# Patient Record
Sex: Female | Born: 1970 | Race: White | Hispanic: No | Marital: Married | Smoking: Never smoker
Health system: Southern US, Community
[De-identification: ages and names within clinical notes are randomized; demographics above are authoritative.]

## PROBLEM LIST (undated history)

## (undated) DIAGNOSIS — D759 Disease of blood and blood-forming organs, unspecified: Secondary | ICD-10-CM

## (undated) DIAGNOSIS — I809 Phlebitis and thrombophlebitis of unspecified site: Secondary | ICD-10-CM

## (undated) DIAGNOSIS — Z86711 Personal history of pulmonary embolism: Secondary | ICD-10-CM

## (undated) HISTORY — PX: ESSURE TUBAL LIGATION: SUR464

---

## 1998-03-13 ENCOUNTER — Other Ambulatory Visit: Admission: RE | Admit: 1998-03-13 | Discharge: 1998-03-13 | Payer: Self-pay | Admitting: Obstetrics and Gynecology

## 1998-10-14 ENCOUNTER — Inpatient Hospital Stay (HOSPITAL_COMMUNITY): Admission: AD | Admit: 1998-10-14 | Discharge: 1998-10-16 | Payer: Self-pay | Admitting: Obstetrics and Gynecology

## 1998-11-14 ENCOUNTER — Other Ambulatory Visit: Admission: RE | Admit: 1998-11-14 | Discharge: 1998-11-14 | Payer: Self-pay | Admitting: Obstetrics and Gynecology

## 1999-10-31 ENCOUNTER — Other Ambulatory Visit: Admission: RE | Admit: 1999-10-31 | Discharge: 1999-10-31 | Payer: Self-pay | Admitting: *Deleted

## 2000-11-15 ENCOUNTER — Other Ambulatory Visit: Admission: RE | Admit: 2000-11-15 | Discharge: 2000-11-15 | Payer: Self-pay | Admitting: *Deleted

## 2001-02-21 ENCOUNTER — Other Ambulatory Visit: Admission: RE | Admit: 2001-02-21 | Discharge: 2001-02-21 | Payer: Self-pay | Admitting: *Deleted

## 2001-08-25 ENCOUNTER — Other Ambulatory Visit: Admission: RE | Admit: 2001-08-25 | Discharge: 2001-08-25 | Payer: Self-pay | Admitting: Obstetrics and Gynecology

## 2002-03-22 ENCOUNTER — Other Ambulatory Visit: Admission: RE | Admit: 2002-03-22 | Discharge: 2002-03-22 | Payer: Self-pay | Admitting: Obstetrics and Gynecology

## 2003-03-30 ENCOUNTER — Other Ambulatory Visit: Admission: RE | Admit: 2003-03-30 | Discharge: 2003-03-30 | Payer: Self-pay | Admitting: Obstetrics and Gynecology

## 2005-02-06 ENCOUNTER — Inpatient Hospital Stay (HOSPITAL_COMMUNITY): Admission: AD | Admit: 2005-02-06 | Discharge: 2005-02-08 | Payer: Self-pay | Admitting: Obstetrics & Gynecology

## 2006-04-22 ENCOUNTER — Inpatient Hospital Stay (HOSPITAL_COMMUNITY): Admission: AD | Admit: 2006-04-22 | Discharge: 2006-04-24 | Payer: Self-pay | Admitting: Obstetrics and Gynecology

## 2010-08-24 DIAGNOSIS — Z86711 Personal history of pulmonary embolism: Secondary | ICD-10-CM

## 2010-08-24 HISTORY — DX: Personal history of pulmonary embolism: Z86.711

## 2010-12-30 ENCOUNTER — Encounter: Payer: Self-pay | Admitting: Oncology

## 2011-01-21 ENCOUNTER — Other Ambulatory Visit: Payer: Self-pay | Admitting: Oncology

## 2011-01-21 ENCOUNTER — Encounter (HOSPITAL_BASED_OUTPATIENT_CLINIC_OR_DEPARTMENT_OTHER): Payer: BC Managed Care – PPO | Admitting: Oncology

## 2011-01-21 DIAGNOSIS — I749 Embolism and thrombosis of unspecified artery: Secondary | ICD-10-CM

## 2011-01-21 LAB — CBC WITH DIFFERENTIAL/PLATELET
Eosinophils Absolute: 0.1 10*3/uL (ref 0.0–0.5)
HCT: 36.3 % (ref 34.8–46.6)
LYMPH%: 36.8 % (ref 14.0–49.7)
MCHC: 34.5 g/dL (ref 31.5–36.0)
MCV: 92.8 fL (ref 79.5–101.0)
MONO#: 0.4 10*3/uL (ref 0.1–0.9)
MONO%: 5.9 % (ref 0.0–14.0)
NEUT#: 3.4 10*3/uL (ref 1.5–6.5)
NEUT%: 54.9 % (ref 38.4–76.8)
Platelets: 250 10*3/uL (ref 145–400)
WBC: 6.1 10*3/uL (ref 3.9–10.3)

## 2011-01-21 LAB — COMPREHENSIVE METABOLIC PANEL
Albumin: 4.3 g/dL (ref 3.5–5.2)
BUN: 19 mg/dL (ref 6–23)
Calcium: 9.1 mg/dL (ref 8.4–10.5)
Chloride: 104 mEq/L (ref 96–112)
Creatinine, Ser: 0.68 mg/dL (ref 0.40–1.20)
Glucose, Bld: 113 mg/dL — ABNORMAL HIGH (ref 70–99)
Potassium: 3.8 mEq/L (ref 3.5–5.3)

## 2011-01-22 LAB — LUPUS ANTICOAGULANT PANEL
Lupus Anticoagulant: DETECTED — AB
PTT Lupus Anticoagulant: 100.3 secs — ABNORMAL HIGH (ref 30.0–45.6)
PTTLA 4:1 Mix: 60.6 secs — ABNORMAL HIGH (ref 30.0–45.6)
PTTLA Confirmation: 13 secs — ABNORMAL HIGH (ref <8.0)

## 2011-01-25 LAB — HEXAGONAL PHOSPHOLIPID NEUTRALIZATION: Hex Phosph Neut Test: POSITIVE — AB

## 2011-02-05 ENCOUNTER — Other Ambulatory Visit: Payer: Self-pay | Admitting: Obstetrics and Gynecology

## 2011-02-05 ENCOUNTER — Encounter (HOSPITAL_COMMUNITY): Payer: BC Managed Care – PPO

## 2011-02-05 LAB — CBC
HCT: 40.5 % (ref 36.0–46.0)
Hemoglobin: 13.8 g/dL (ref 12.0–15.0)
RBC: 4.37 MIL/uL (ref 3.87–5.11)
WBC: 8 10*3/uL (ref 4.0–10.5)

## 2011-02-05 LAB — PROTIME-INR
INR: 3.17 — ABNORMAL HIGH (ref 0.00–1.49)
Prothrombin Time: 33 seconds — ABNORMAL HIGH (ref 11.6–15.2)

## 2011-02-05 LAB — APTT: aPTT: 63 seconds — ABNORMAL HIGH (ref 24–37)

## 2011-02-17 ENCOUNTER — Ambulatory Visit (HOSPITAL_COMMUNITY)
Admission: RE | Admit: 2011-02-17 | Discharge: 2011-02-17 | Disposition: A | Discharge status: Home / Self Care | Payer: BC Managed Care – PPO | Source: Ambulatory Visit | Attending: Obstetrics and Gynecology | Admitting: Obstetrics and Gynecology

## 2011-02-17 DIAGNOSIS — Z302 Encounter for sterilization: Secondary | ICD-10-CM | POA: Insufficient documentation

## 2011-02-17 DIAGNOSIS — Z01818 Encounter for other preprocedural examination: Secondary | ICD-10-CM | POA: Insufficient documentation

## 2011-02-17 DIAGNOSIS — Z01812 Encounter for preprocedural laboratory examination: Secondary | ICD-10-CM | POA: Insufficient documentation

## 2011-02-17 LAB — HCG, SERUM, QUALITATIVE: Preg, Serum: NEGATIVE

## 2011-02-27 NOTE — Op Note (Signed)
  Meagan Greer, Meagan Greer                 ACCOUNT NO.:  [id number]  MEDICAL RECORD NO.:  [id number]  LOCATION:  [facility]                          [facility]  PHYSICIAN:  Lenoard Aden, M.D.DATE OF BIRTH:  1971-02-03  DATE OF PROCEDURE:  02/17/2011 DATE OF DISCHARGE:                              OPERATIVE REPORT   PREOPERATIVE DIAGNOSES: 1. Desire for elective sterilization. 2. History of thrombophilia with lupus anticoagulant disorder and     history of pulmonary embolism.  POSTOPERATIVE DIAGNOSES: 1. Desire for elective sterilization. 2. History of thrombophilia with lupus anticoagulant disorder and     history of pulmonary embolism.  PROCEDURE:  Essure tubal ligation.  SURGEON:  Lenoard Aden, MD  ASSISTANT:  None.  ANESTHESIA:  General and local.  ESTIMATED BLOOD LOSS:  Less than 50 mL.  COMPLICATIONS:  None.  DRAINS:  Foley.  COUNTS:  Correct.  The patient recovery in good condition.  BRIEF OPERATIVE NOTE:  After being apprised of risks of anesthesia, infection, bleeding, injury to abdominal organs, need for repair, delayed versus immediate complications to include bowel and bladder injury, possible need for repair, the patient was brought to the operating room where she was administered general anesthetic without complications.  Prepped and draped in usual sterile fashion. Catheterized until the bladder was empty.  Exam under anesthesia reveals a small mid positioned uterus and no adnexal masses.  Weighted speculum was placed.  Dilute Marcaine block was placed using dilute 20 mL of dilute solution in a standard fashion.  Cervix was grasped with a single- tooth tenaculum and dilated easily up to a 21 Pratt dilator. Hysteroscope placed.  Visualization reveals a normal endometrial cavity and bilateral normal tubal ostia.  No evidence of uterine perforation. The Essure tubal ligation system was introduced and the coils were placed in a standard fashion with  approximately 5 coils seen after placement and deployment in both tubes using the standard.  Good hemostasis was noted.  Devices were removed after introduction of the Essure into the left and then into the right tube.  Visualization reveals an otherwise normal endometrial cavity. The patient tolerated the procedure well, all instruments were removed. The patient was transferred to recovery in good condition.     Lenoard Aden, M.D.     RJT/MEDQ  D:  02/17/2011  T:  02/18/2011  Job:  [id number]  Electronically Signed by Olivia Mackie M.D. on 02/27/2011 07:36:11 AM

## 2011-02-27 NOTE — H&P (Signed)
  Meagan Greer, Meagan Greer                 ACCOUNT NO.:  [id number]  MEDICAL RECORD NO.:  [id number]  LOCATION:                                 FACILITY:  PHYSICIAN:  Lenoard Aden, M.D.DATE OF BIRTH:  01-Jul-1971  DATE OF ADMISSION:  02/17/2011 DATE OF DISCHARGE:                             HISTORY & PHYSICAL   CHIEF COMPLAINT:  Elective sterilization.  HISTORY OF PRESENT ILLNESS:  A 40 year old white female G3, P3 with new- onset history of thrombophilia, history of pulmonary embolism, lupus anticoagulant, who presents for desire for permanent sterilization.  She has a history of vaginal delivery x3.  She has family history of diabetes and heart disease.  She has no known drug allergies.  Her medications include Camila, Ambien, Xanax, Levoxyl, Effexor, multivitamin, and is currently off her Coumadin and Lovenox, holding for surgery.  PHYSICAL EXAMINATION:  GENERAL:  She is a well-developed, well-nourished white female, in no acute distress. HEENT:  Normal. NECK:  Supple.  Full range of motion. LUNGS:  Clear. HEART:  Regular rhythm. ABDOMEN:  Soft, nontender. PELVIC:  Revealed the uterus to be anteflexed and no adnexal masses. EXTREMITIES:  There are no cords. NEUROLOGIC:  Nonfocal. SKIN:  Intact.  IMPRESSION: 1. Desire for elective sterilization. 2. History of thrombophilia with a history of pulmonary embolism.  PLAN:  To proceed with the Essure tubal sterilization, resume Lovenox 12 hours postoperatively.     Lenoard Aden, M.D.     RJT/MEDQ  D:  02/17/2011  T:  02/17/2011  Job:  [id number]  Electronically Signed by Olivia Mackie M.D. on 02/27/2011 07:36:15 AM

## 2011-06-01 ENCOUNTER — Other Ambulatory Visit (HOSPITAL_COMMUNITY): Payer: Self-pay | Admitting: Obstetrics and Gynecology

## 2011-06-01 DIAGNOSIS — N971 Female infertility of tubal origin: Secondary | ICD-10-CM

## 2011-06-12 ENCOUNTER — Ambulatory Visit (HOSPITAL_COMMUNITY)
Admission: RE | Admit: 2011-06-12 | Discharge: 2011-06-12 | Disposition: A | Discharge status: Home / Self Care | Payer: BC Managed Care – PPO | Source: Ambulatory Visit | Attending: Obstetrics and Gynecology | Admitting: Obstetrics and Gynecology

## 2011-06-12 DIAGNOSIS — Z3049 Encounter for surveillance of other contraceptives: Secondary | ICD-10-CM | POA: Insufficient documentation

## 2011-06-12 DIAGNOSIS — N971 Female infertility of tubal origin: Secondary | ICD-10-CM

## 2011-06-12 LAB — PREGNANCY, URINE: Preg Test, Ur: NEGATIVE

## 2011-06-12 MED ORDER — IOHEXOL 180 MG/ML  SOLN: 5.0000 mL | Freq: Once | INTRAMUSCULAR | Status: AC | PRN

## 2013-02-23 ENCOUNTER — Other Ambulatory Visit: Payer: Self-pay

## 2013-02-23 DIAGNOSIS — Z1231 Encounter for screening mammogram for malignant neoplasm of breast: Secondary | ICD-10-CM

## 2013-04-05 ENCOUNTER — Ambulatory Visit
Admission: RE | Admit: 2013-04-05 | Discharge: 2013-04-05 | Disposition: A | Discharge status: Home / Self Care | Payer: BC Managed Care – PPO | Source: Ambulatory Visit

## 2013-04-05 DIAGNOSIS — Z1231 Encounter for screening mammogram for malignant neoplasm of breast: Secondary | ICD-10-CM

## 2013-04-06 ENCOUNTER — Other Ambulatory Visit: Payer: Self-pay | Admitting: Obstetrics and Gynecology

## 2013-04-06 DIAGNOSIS — R928 Other abnormal and inconclusive findings on diagnostic imaging of breast: Secondary | ICD-10-CM

## 2013-04-27 ENCOUNTER — Ambulatory Visit
Admission: RE | Admit: 2013-04-27 | Discharge: 2013-04-27 | Disposition: A | Discharge status: Home / Self Care | Payer: BC Managed Care – PPO | Source: Ambulatory Visit | Attending: Obstetrics and Gynecology | Admitting: Obstetrics and Gynecology

## 2013-04-27 DIAGNOSIS — R928 Other abnormal and inconclusive findings on diagnostic imaging of breast: Secondary | ICD-10-CM

## 2014-03-09 ENCOUNTER — Telehealth: Payer: Self-pay | Admitting: Medical Oncology

## 2014-03-09 NOTE — Telephone Encounter (Signed)
Error

## 2014-03-19 ENCOUNTER — Other Ambulatory Visit: Payer: Self-pay | Admitting: Obstetrics and Gynecology

## 2014-03-19 ENCOUNTER — Encounter (HOSPITAL_COMMUNITY): Payer: Self-pay | Admitting: Pharmacist

## 2014-03-20 ENCOUNTER — Encounter (HOSPITAL_COMMUNITY): Payer: Self-pay | Admitting: *Deleted

## 2014-03-26 NOTE — H&P (Signed)
[name]:  [place], Dyan                 ACCOUNT NO.:  [id number]  MEDICAL RECORD NO.:  [id number]  LOCATION:  [facility]                         [facility]:  [facility]  PHYSICIAN:  Lenoard Adenichard J. Deidra Spease, M.D.DATE OF BIRTH:  [birth year]  DATE OF ADMISSION:  03/27/2014 DATE OF DISCHARGE:                             HISTORY & PHYSICAL   CHIEF COMPLAINT:  Refractory menorrhagia.  HISTORY OF PRESENT ILLNESS:  A 43 year old white female G3, P3, who presents with menorrhagia and secondary anemia for definitive therapy.  MEDICATIONS:  Thyroid replacement medication, multivitamin, Xarelto, which has been discontinued for 24 hours, and Prozac.  ALLERGIES:  She has no known drug allergies.  FAMILY HISTORY:  Diabetes and heart disease.  PERSONAL HISTORY:  Vaginal delivery x3.  Essure tubal sterilization in 2012 with negative postoperative HSG.  MEDICAL PROBLEMS:  Include depression, thyroid disease, and menorrhagia.  PHYSICAL EXAMINATION:  Well-developed, well-nourished white female, in no acute distress. HEENT:  Normal. NECK:  Supple.  Full range of motion. LUNGS:  Clear. HEART:  Regular rate and rhythm. ABDOMEN:  Soft and nontender. PELVIC EXAM:  Reveals an anteflexed uterus and no adnexal masses. EXTREMITIES:  There are no cords. NEUROLOGIC:  Nonfocal. SKIN:  Intact.  IMPRESSION: 1. Refractory menorrhagia. 2. Antiphospholipid antibody syndrome, on anticoagulation.  PLAN:  Proceed with diagnostic hysteroscopy, D and C, NovaSure endometrial ablation.  Risks of anesthesia, infection, bleeding, injury to surrounding organs, possible need for repair was discussed.  Delayed versus immediate complications to include bowel and bladder injury noted.  The patient acknowledges and wishes to proceed.     Lenoard Adenichard J. Keirstan Iannello, M.D.     RJT/MEDQ  D:  03/26/2014  T:  03/26/2014  Job:  [id number]

## 2014-03-27 ENCOUNTER — Ambulatory Visit (HOSPITAL_COMMUNITY): Payer: BC Managed Care – PPO | Admitting: Certified Registered Nurse Anesthetist

## 2014-03-27 ENCOUNTER — Ambulatory Visit (HOSPITAL_COMMUNITY)
Admission: RE | Admit: 2014-03-27 | Discharge: 2014-03-27 | Disposition: A | Discharge status: Home / Self Care | Payer: BC Managed Care – PPO | Source: Ambulatory Visit | Attending: Obstetrics and Gynecology | Admitting: Obstetrics and Gynecology

## 2014-03-27 ENCOUNTER — Encounter (HOSPITAL_COMMUNITY): Payer: BC Managed Care – PPO | Admitting: Certified Registered Nurse Anesthetist

## 2014-03-27 ENCOUNTER — Encounter (HOSPITAL_COMMUNITY)
Admission: RE | Disposition: A | Discharge status: Home / Self Care | Payer: Self-pay | Source: Ambulatory Visit | Attending: Obstetrics and Gynecology

## 2014-03-27 DIAGNOSIS — D649 Anemia, unspecified: Secondary | ICD-10-CM | POA: Insufficient documentation

## 2014-03-27 DIAGNOSIS — N920 Excessive and frequent menstruation with regular cycle: Secondary | ICD-10-CM | POA: Insufficient documentation

## 2014-03-27 DIAGNOSIS — D6859 Other primary thrombophilia: Secondary | ICD-10-CM | POA: Insufficient documentation

## 2014-03-27 DIAGNOSIS — F3289 Other specified depressive episodes: Secondary | ICD-10-CM | POA: Insufficient documentation

## 2014-03-27 DIAGNOSIS — E079 Disorder of thyroid, unspecified: Secondary | ICD-10-CM | POA: Insufficient documentation

## 2014-03-27 DIAGNOSIS — D759 Disease of blood and blood-forming organs, unspecified: Secondary | ICD-10-CM | POA: Insufficient documentation

## 2014-03-27 DIAGNOSIS — F329 Major depressive disorder, single episode, unspecified: Secondary | ICD-10-CM | POA: Insufficient documentation

## 2014-03-27 HISTORY — PX: DILITATION & CURRETTAGE/HYSTROSCOPY WITH NOVASURE ABLATION: SHX5568

## 2014-03-27 HISTORY — DX: Disease of blood and blood-forming organs, unspecified: D75.9

## 2014-03-27 HISTORY — DX: Personal history of pulmonary embolism: Z86.711

## 2014-03-27 HISTORY — DX: Phlebitis and thrombophlebitis of unspecified site: I80.9

## 2014-03-27 LAB — COMPREHENSIVE METABOLIC PANEL
ALBUMIN: 3.9 g/dL (ref 3.5–5.2)
ALT: 19 U/L (ref 0–35)
AST: 22 U/L (ref 0–37)
Alkaline Phosphatase: 70 U/L (ref 39–117)
Anion gap: 10 (ref 5–15)
BUN: 20 mg/dL (ref 6–23)
CALCIUM: 9.2 mg/dL (ref 8.4–10.5)
CHLORIDE: 103 meq/L (ref 96–112)
CO2: 26 mEq/L (ref 19–32)
CREATININE: 0.68 mg/dL (ref 0.50–1.10)
GFR calc Af Amer: >90 mL/min (ref >90)
GFR calc non Af Amer: >90 mL/min (ref >90)
Glucose, Bld: 86 mg/dL (ref 70–99)
Potassium: 4.5 mEq/L (ref 3.7–5.3)
Sodium: 139 mEq/L (ref 137–147)
Total Bilirubin: 0.9 mg/dL (ref 0.3–1.2)
Total Protein: 7.3 g/dL (ref 6.0–8.3)

## 2014-03-27 LAB — CBC
HCT: 34.5 % — ABNORMAL LOW (ref 36.0–46.0)
Hemoglobin: 11.7 g/dL — ABNORMAL LOW (ref 12.0–15.0)
MCH: 31.3 pg (ref 26.0–34.0)
MCHC: 33.9 g/dL (ref 30.0–36.0)
MCV: 92.2 fL (ref 78.0–100.0)
PLATELETS: 257 10*3/uL (ref 150–400)
RBC: 3.74 MIL/uL — ABNORMAL LOW (ref 3.87–5.11)
RDW: 12.6 % (ref 11.5–15.5)
WBC: 6.1 10*3/uL (ref 4.0–10.5)

## 2014-03-27 LAB — HCG, SERUM, QUALITATIVE: PREG SERUM: NEGATIVE

## 2014-03-27 SURGERY — DILATATION & CURETTAGE/HYSTEROSCOPY WITH NOVASURE ABLATION
Anesthesia: General | Site: Vagina

## 2014-03-27 MED ORDER — KETOROLAC TROMETHAMINE 30 MG/ML IJ SOLN
INTRAMUSCULAR | Status: AC
  Filled 2014-03-27: qty 1

## 2014-03-27 MED ORDER — MIDAZOLAM HCL 2 MG/2ML IJ SOLN
INTRAMUSCULAR | Status: DC | PRN
  Administered 2014-03-27: 2 mg via INTRAVENOUS

## 2014-03-27 MED ORDER — OXYCODONE-ACETAMINOPHEN 5-325 MG PO TABS: 1.0000 | ORAL_TABLET | ORAL | Status: AC | PRN

## 2014-03-27 MED ORDER — KETOROLAC TROMETHAMINE 30 MG/ML IJ SOLN: 15.0000 mg | Freq: Once | INTRAMUSCULAR | Status: DC | PRN

## 2014-03-27 MED ORDER — GLYCOPYRROLATE 0.2 MG/ML IJ SOLN
INTRAMUSCULAR | Status: AC
  Filled 2014-03-27: qty 1

## 2014-03-27 MED ORDER — MIDAZOLAM HCL 2 MG/2ML IJ SOLN: 0.5000 mg | Freq: Once | INTRAMUSCULAR | Status: DC | PRN

## 2014-03-27 MED ORDER — FENTANYL CITRATE 0.05 MG/ML IJ SOLN: 25.0000 ug | INTRAMUSCULAR | Status: DC | PRN

## 2014-03-27 MED ORDER — MEPERIDINE HCL 25 MG/ML IJ SOLN
6.2500 mg | INTRAMUSCULAR | Status: DC | PRN
Start: 2014-03-27 — End: 2014-03-27

## 2014-03-27 MED ORDER — MIDAZOLAM HCL 2 MG/2ML IJ SOLN
INTRAMUSCULAR | Status: AC
  Filled 2014-03-27: qty 2

## 2014-03-27 MED ORDER — PROPOFOL 10 MG/ML IV BOLUS
INTRAVENOUS | Status: DC | PRN
  Administered 2014-03-27: 200 mg via INTRAVENOUS

## 2014-03-27 MED ORDER — FENTANYL CITRATE 0.05 MG/ML IJ SOLN
INTRAMUSCULAR | Status: AC
  Filled 2014-03-27: qty 2

## 2014-03-27 MED ORDER — SODIUM CHLORIDE 0.9 % IJ SOLN
INTRAMUSCULAR | Status: AC
  Filled 2014-03-27: qty 50

## 2014-03-27 MED ORDER — BUPIVACAINE HCL (PF) 0.25 % IJ SOLN
INTRAMUSCULAR | Status: DC | PRN
  Administered 2014-03-27: 20 mL

## 2014-03-27 MED ORDER — CEFAZOLIN SODIUM-DEXTROSE 2-3 GM-% IV SOLR
INTRAVENOUS | Status: AC
  Filled 2014-03-27: qty 50

## 2014-03-27 MED ORDER — CEFAZOLIN SODIUM-DEXTROSE 2-3 GM-% IV SOLR
2.0000 g | INTRAVENOUS | Status: AC
  Administered 2014-03-27: 2 g via INTRAVENOUS

## 2014-03-27 MED ORDER — LIDOCAINE HCL (CARDIAC) 20 MG/ML IV SOLN
INTRAVENOUS | Status: AC
  Filled 2014-03-27: qty 5

## 2014-03-27 MED ORDER — ONDANSETRON HCL 4 MG/2ML IJ SOLN
INTRAMUSCULAR | Status: DC | PRN
  Administered 2014-03-27: 4 mg via INTRAVENOUS

## 2014-03-27 MED ORDER — FENTANYL CITRATE 0.05 MG/ML IJ SOLN
INTRAMUSCULAR | Status: DC | PRN
  Administered 2014-03-27: 50 ug via INTRAVENOUS
  Administered 2014-03-27 (×2): 25 ug via INTRAVENOUS

## 2014-03-27 MED ORDER — BUPIVACAINE HCL (PF) 0.25 % IJ SOLN
INTRAMUSCULAR | Status: AC
  Filled 2014-03-27: qty 30

## 2014-03-27 MED ORDER — LACTATED RINGERS IV SOLN
INTRAVENOUS | Status: DC
  Administered 2014-03-27 (×2): via INTRAVENOUS

## 2014-03-27 MED ORDER — LACTATED RINGERS IR SOLN
Status: DC | PRN
  Administered 2014-03-27: 3000 mL

## 2014-03-27 MED ORDER — DEXAMETHASONE SODIUM PHOSPHATE 10 MG/ML IJ SOLN
INTRAMUSCULAR | Status: AC
  Filled 2014-03-27: qty 1

## 2014-03-27 MED ORDER — PROMETHAZINE HCL 25 MG/ML IJ SOLN: 6.2500 mg | INTRAMUSCULAR | Status: DC | PRN

## 2014-03-27 MED ORDER — SCOPOLAMINE 1 MG/3DAYS TD PT72
MEDICATED_PATCH | TRANSDERMAL | Status: AC
  Administered 2014-03-27: 1.5 mg via TRANSDERMAL
  Filled 2014-03-27: qty 1

## 2014-03-27 MED ORDER — LIDOCAINE HCL (CARDIAC) 20 MG/ML IV SOLN
INTRAVENOUS | Status: DC | PRN
  Administered 2014-03-27: 60 mg via INTRAVENOUS

## 2014-03-27 MED ORDER — KETOROLAC TROMETHAMINE 30 MG/ML IJ SOLN
INTRAMUSCULAR | Status: DC | PRN
  Administered 2014-03-27: 30 mg via INTRAVENOUS

## 2014-03-27 MED ORDER — DEXAMETHASONE SODIUM PHOSPHATE 10 MG/ML IJ SOLN
INTRAMUSCULAR | Status: DC | PRN
  Administered 2014-03-27: 10 mg via INTRAVENOUS

## 2014-03-27 MED ORDER — SCOPOLAMINE 1 MG/3DAYS TD PT72
1.0000 | MEDICATED_PATCH | Freq: Once | TRANSDERMAL | Status: AC
  Administered 2014-03-27: 1 via TRANSDERMAL
  Administered 2014-03-27: 1.5 mg via TRANSDERMAL

## 2014-03-27 MED ORDER — VASOPRESSIN 20 UNIT/ML IJ SOLN
INTRAMUSCULAR | Status: AC
  Filled 2014-03-27: qty 1

## 2014-03-27 MED ORDER — ONDANSETRON HCL 4 MG/2ML IJ SOLN
INTRAMUSCULAR | Status: AC
  Filled 2014-03-27: qty 2

## 2014-03-27 SURGICAL SUPPLY — 15 items
ABLATOR ENDOMETRIAL BIPOLAR (ABLATOR) ×3 IMPLANT
CATH ROBINSON RED A/P 16FR (CATHETERS) ×3 IMPLANT
CLOTH BEACON ORANGE TIMEOUT ST (SAFETY) ×3 IMPLANT
CONTAINER PREFILL 10% NBF 60ML (FORM) ×6 IMPLANT
DRAPE HYSTEROSCOPY (DRAPE) ×3 IMPLANT
GLOVE BIO SURGEON STRL SZ7.5 (GLOVE) ×3 IMPLANT
GOWN STRL REUS W/TWL LRG LVL3 (GOWN DISPOSABLE) ×6 IMPLANT
PACK VAGINAL MINOR WOMEN LF (CUSTOM PROCEDURE TRAY) ×3 IMPLANT
PAD OB MATERNITY 4.3X12.25 (PERSONAL CARE ITEMS) ×3 IMPLANT
PAD PREP 24X48 CUFFED NSTRL (MISCELLANEOUS) ×3 IMPLANT
SET TUBING HYSTEROSCOPY 2 NDL (TUBING) ×2 IMPLANT
SYR TB 1ML 25GX5/8 (SYRINGE) ×3 IMPLANT
TOWEL OR 17X24 6PK STRL BLUE (TOWEL DISPOSABLE) ×6 IMPLANT
TUBE HYSTEROSCOPY W Y-CONNECT (TUBING) ×2 IMPLANT
WATER STERILE IRR 1000ML POUR (IV SOLUTION) ×3 IMPLANT

## 2014-03-27 NOTE — Anesthesia Postprocedure Evaluation (Signed)
Anesthesia Post Note  Patient: Meagan Greer  Procedure(s) Performed: Procedure(s) (LRB): DILATATION & CURETTAGE/HYSTEROSCOPY WITH NOVASURE ABLATION (N/A)  Anesthesia type: General  Patient location: PACU  Post pain: Pain level controlled  Post assessment: Post-op Vital signs reviewed  Last Vitals:  Filed Vitals:   03/27/14 1230  BP: 114/68  Pulse: 68  Temp: 36.6 C  Resp: 22    Post vital signs: Reviewed  Level of consciousness: sedated  Complications: No apparent anesthesia complications

## 2014-03-27 NOTE — Op Note (Signed)
03/27/2014  11:22 AM  PATIENT:  Meagan Greer  43 y.o. female  PRE-OPERATIVE DIAGNOSIS:  Menorrhagia  POST-OPERATIVE DIAGNOSIS:  menorrhagia  PROCEDURE:  Procedure(s): DILATATION & CURETTAGE/HYSTEROSCOPY WITH NOVASURE ABLATION  SURGEON:  Surgeon(s): Lenoard Adenichard J Macari Zalesky, MD  ASSISTANTS: none   ANESTHESIA:   local and general  ESTIMATED BLOOD LOSS: minimal  DRAINS: none   LOCAL MEDICATIONS USED:  MARCAINE    and Amount: 20 ml  SPECIMEN:  Source of Specimen:  [facility]  DISPOSITION OF SPECIMEN:  PATHOLOGY  COUNTS:  YES  DICTATION #: [id number]  PLAN OF CARE: dc home  PATIENT DISPOSITION:  PACU - hemodynamically stable.

## 2014-03-27 NOTE — Anesthesia Preprocedure Evaluation (Signed)
Anesthesia Evaluation  Patient identified by MRN, date of birth, ID band Patient awake    Reviewed: Allergy & Precautions, H&P , Patient's Chart, lab work & pertinent test results, reviewed documented beta blocker date and time   History of Anesthesia Complications Negative for: history of anesthetic complications  Airway Mallampati: II TM Distance: >3 FB Neck ROM: full    Dental   Pulmonary  breath sounds clear to auscultation        Cardiovascular Exercise Tolerance: Good Rhythm:regular Rate:Normal     Neuro/Psych negative psych ROS   GI/Hepatic   Endo/Other    Renal/GU      Musculoskeletal   Abdominal   Peds  Hematology  (+) Blood dyscrasia, ,   Anesthesia Other Findings pe in 2012  Reproductive/Obstetrics                           Anesthesia Physical Anesthesia Plan  ASA: II  Anesthesia Plan: General LMA   Post-op Pain Management:    Induction:   Airway Management Planned:   Additional Equipment:   Intra-op Plan:   Post-operative Plan:   Informed Consent: I have reviewed the patients History and Physical, chart, labs and discussed the procedure including the risks, benefits and alternatives for the proposed anesthesia with the patient or authorized representative who has indicated his/her understanding and acceptance.   Dental Advisory Given  Plan Discussed with: CRNA, Surgeon and Anesthesiologist  Anesthesia Plan Comments:         Anesthesia Quick Evaluation

## 2014-03-27 NOTE — Transfer of Care (Signed)
Immediate Anesthesia Transfer of Care Note  Patient: Meagan Greer  Procedure(s) Performed: Procedure(s): DILATATION & CURETTAGE/HYSTEROSCOPY WITH NOVASURE ABLATION (N/A)  Patient Location: PACU  Anesthesia Type:General  Level of Consciousness: awake, alert  and oriented  Airway & Oxygen Therapy: Patient Spontanous Breathing and Patient connected to nasal cannula oxygen  Post-op Assessment: Report given to PACU RN, Post -op Vital signs reviewed and stable and Patient moving all extremities X 4  Post vital signs: Reviewed and stable  Complications: No apparent anesthesia complications

## 2014-03-27 NOTE — Op Note (Signed)
[name]:  Meagan Greer, Meagan Greer                 ACCOUNT NO.:  [id number]  MEDICAL RECORD NO.:  [id number]  LOCATION:  [facility]                          [facility]  PHYSICIAN:  Lenoard Adenichard J. Yuvaan Olander, M.D.DATE OF BIRTH:  [birth year]  DATE OF PROCEDURE: DATE OF DISCHARGE:  03/27/2014                              OPERATIVE REPORT   PREOPERATIVE DIAGNOSIS:  Refractory menorrhagia with secondary anemia.  POSTOPERATIVE DIAGNOSIS:  Refractory menorrhagia with secondary anemia.  PROCEDURE:  Diagnostic hysteroscopy, dilation and curettage with NovaSure endometrial ablation.  SURGEON:  Lenoard Adenichard J. Timira Bieda, MD  ASSISTANT:  None.  ANESTHESIA:  General and local.  ESTIMATED BLOOD LOSS:  Less than 50 mL.  FLUID DEFICIT:  50 mL.  COMPLICATIONS:  None.  DRAINS:  None.  COUNTS:  Correct.  The patient to recovery in good condition.  SPECIMENS:  Endometrial curettings to pathology.  BRIEF OPERATIVE NOTE:  After being apprised of risks of anesthesia, infection, bleeding, injury to surrounding organs, and possible need for repair, delayed versus immediate complications to include bowel and bladder injury, possible need for repair, the patient was brought to the operating room where she was administered general anesthetic without complications.  Prepped and draped in usual sterile fashion, catheterized until the bladder was empty.  Exam under anesthesia reveals an anteflexed uterus and no adnexal masses.  At this time, cervix was grasped using a single-tooth tenaculum.  A dilute Marcaine solution 20 mL was applied with a standard paracervical block.  Cervix was easily dilated up to a 21 Pratt dilator.  Hysteroscope placed.  Visualization reveals a normal Essure placement on the left, and there was no evidence of the Essure placement on the right, although a preoperative normal HSG has been previously noted.  At this time, the rest of the cavity appeared normal.  The D and C was  collected using sharp  curettage in a 4-quadrant method.  The hysteroscope was removed.  The NovaSure device was seated in the standard fashion to a length of 6 and a width of 3. The device was initiated with a power after the CO2 test was performed and was negative.  After the NovaSure procedure, the device was removed and inspected and found to be intact.  Revisualization of the endometrial cavity revealed no evidence of uterine perforation, and a normal presenting still Essure placement on the left, and similarly presenting as noted on the right, a well ablated cavity was noted.  The patient tolerated this procedure well.  All instruments were removed.  The patient was transferred to recovery in good condition.     Lenoard Adenichard J. Nur Rabold, M.D.     RJT/MEDQ  D:  03/27/2014  T:  03/27/2014  Job:  [id number]

## 2014-03-27 NOTE — Progress Notes (Signed)
Patient ID: Meagan Greer, female   DOB: 05/21/1971, 43 y.o.   MRN: [id number] Patient seen and examined. Consent witnessed and signed. No changes noted. Update completed. CBC    Component Value Date/Time   WBC 6.1 03/27/2014 0915   WBC 6.1 01/21/2011 1328   RBC 3.74* 03/27/2014 0915   RBC 3.91 01/21/2011 1328   HGB 11.7* 03/27/2014 0915   HGB 12.5 01/21/2011 1328   HCT 34.5* 03/27/2014 0915   HCT 36.3 01/21/2011 1328   PLT 257 03/27/2014 0915   PLT 250 01/21/2011 1328   MCV 92.2 03/27/2014 0915   MCV 92.8 01/21/2011 1328   MCH 31.3 03/27/2014 0915   MCH 32.0 01/21/2011 1328   MCHC 33.9 03/27/2014 0915   MCHC 34.5 01/21/2011 1328   RDW 12.6 03/27/2014 0915   RDW 12.8 01/21/2011 1328   LYMPHSABS 2.2 01/21/2011 1328   MONOABS 0.4 01/21/2011 1328   EOSABS 0.1 01/21/2011 1328   BASOSABS 0.0 01/21/2011 1328

## 2014-03-28 ENCOUNTER — Encounter (HOSPITAL_COMMUNITY): Payer: Self-pay | Admitting: Obstetrics and Gynecology

## 2017-04-13 ENCOUNTER — Other Ambulatory Visit: Payer: Self-pay | Admitting: Obstetrics and Gynecology

## 2017-04-13 DIAGNOSIS — R928 Other abnormal and inconclusive findings on diagnostic imaging of breast: Secondary | ICD-10-CM

## 2017-04-20 ENCOUNTER — Ambulatory Visit
Admission: RE | Admit: 2017-04-20 | Discharge: 2017-04-20 | Disposition: A | Discharge status: Home / Self Care | Payer: Commercial Managed Care - PPO | Source: Ambulatory Visit | Attending: Obstetrics and Gynecology | Admitting: Obstetrics and Gynecology

## 2017-04-20 ENCOUNTER — Ambulatory Visit
Admission: RE | Admit: 2017-04-20 | Discharge: 2017-04-20 | Disposition: A | Discharge status: Home / Self Care | Payer: BC Managed Care – PPO | Source: Ambulatory Visit | Attending: Obstetrics and Gynecology | Admitting: Obstetrics and Gynecology

## 2017-04-20 DIAGNOSIS — R928 Other abnormal and inconclusive findings on diagnostic imaging of breast: Secondary | ICD-10-CM

## 2018-06-24 IMAGING — US ULTRASOUND LEFT BREAST LIMITED
1 series · 13 of 14 positions shown · non-contrast
Comparison: April 08, 2017

CLINICAL DATA: 46-year-old patient recalled from recent screening
mammogram for evaluation of a possible mass in the upper-outer
quadrant of the left breast.

When I was in the ultrasound room with the patient today, she asked
me to evaluate a small superficial lump associated with the skin of
the far medial right breast. See below.
EXAM:
2D DIGITAL DIAGNOSTIC LEFT MAMMOGRAM WITH ADJUNCT TOMO
ULTRASOUND LEFT BREAST

[Series 1: ultrasound left breast limited · 0.07mm/px · 13 of 14 slices shown]
[im 1/14]
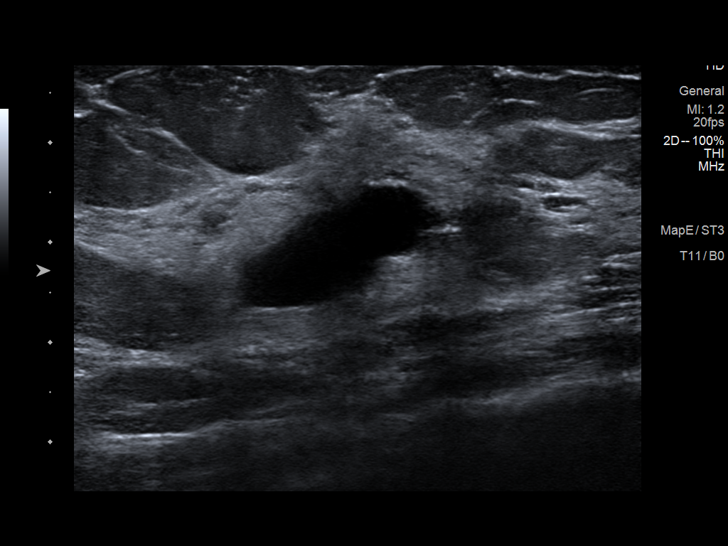
[im 2/14]
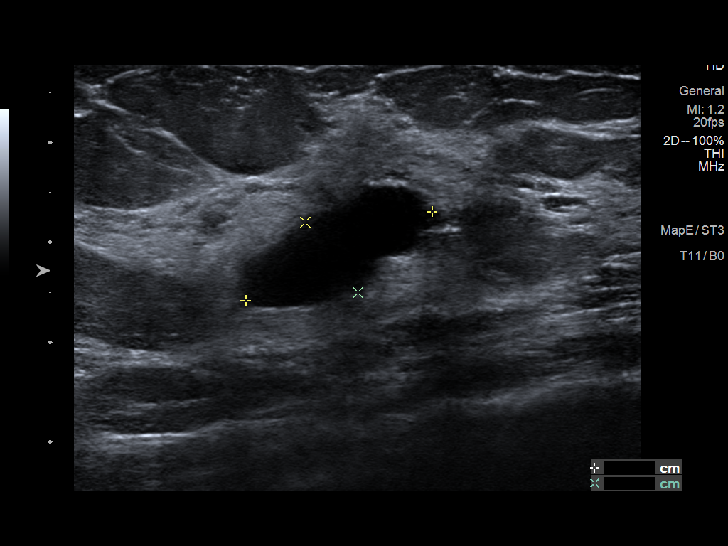
[im 3/14]
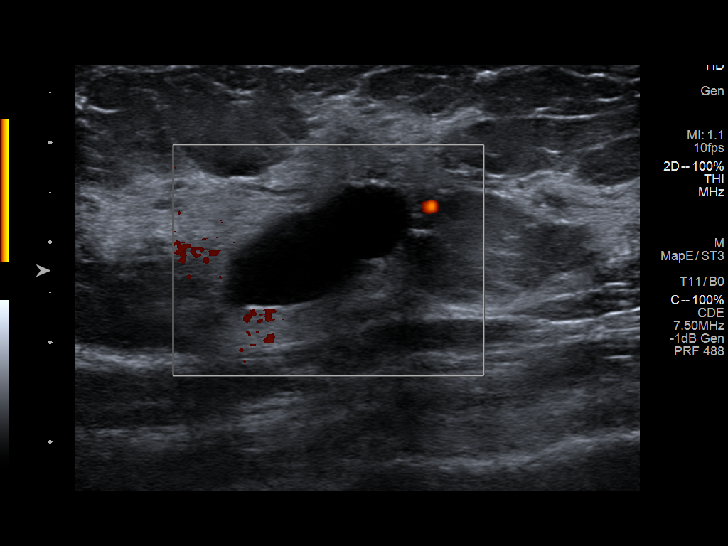
[im 4/14]
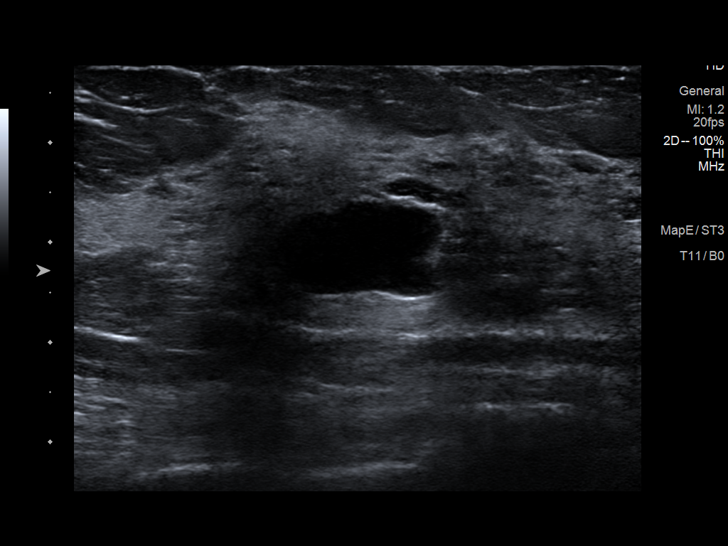
[im 5/14]
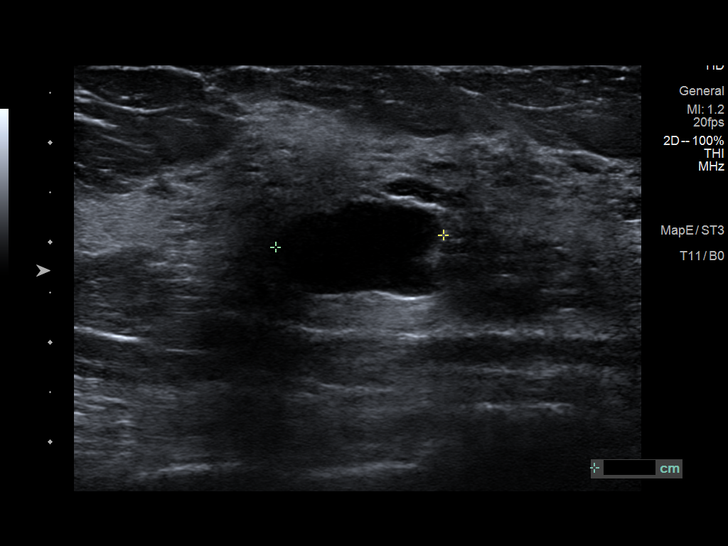
[im 6/14]
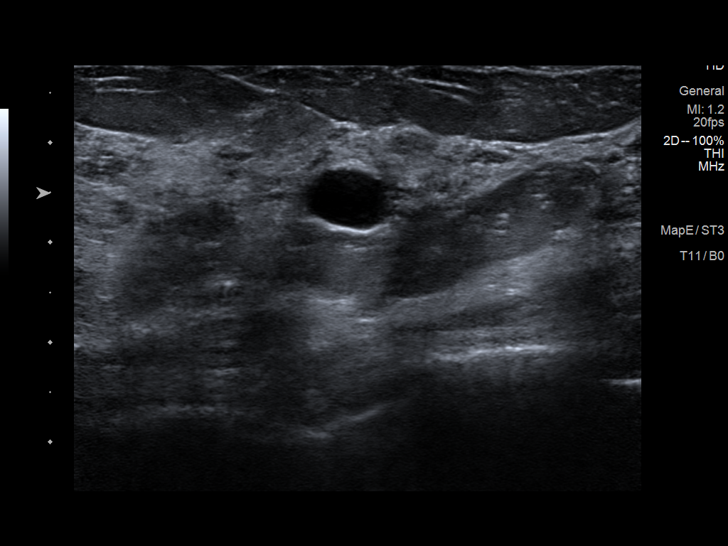
[im 8/14]
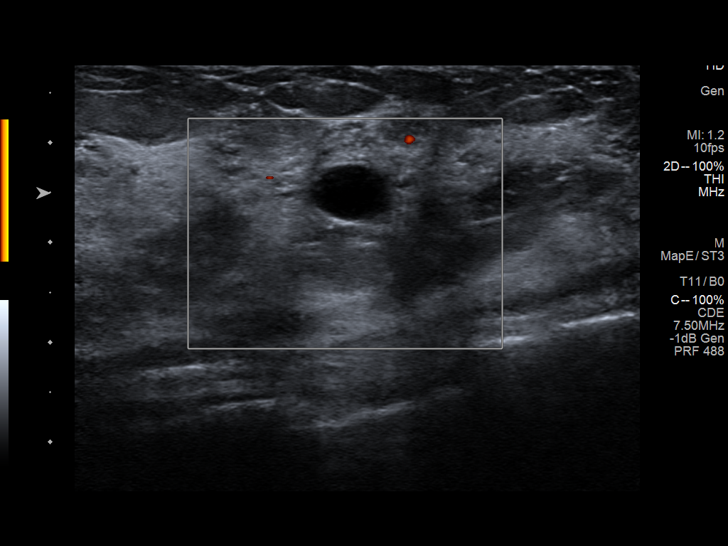
[im 9/14]
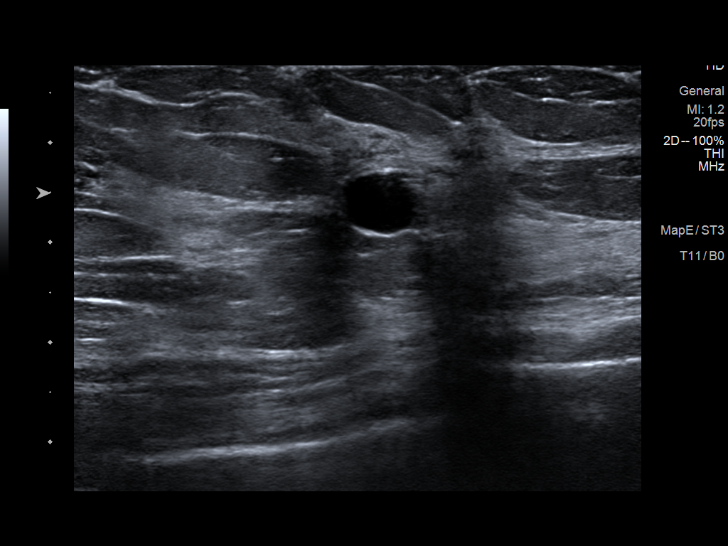
[im 10/14]
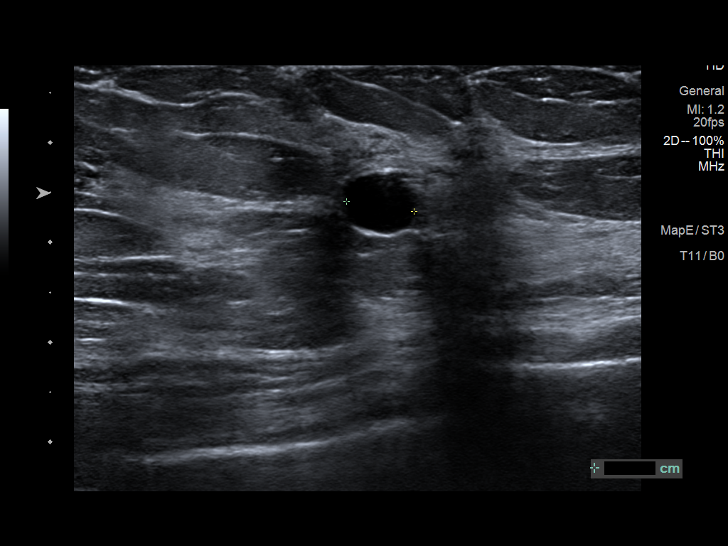
[im 11/14]
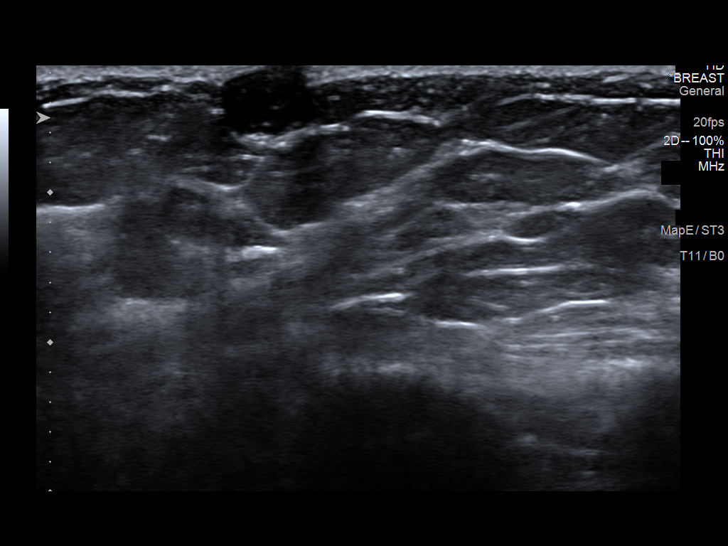
[im 12/14]
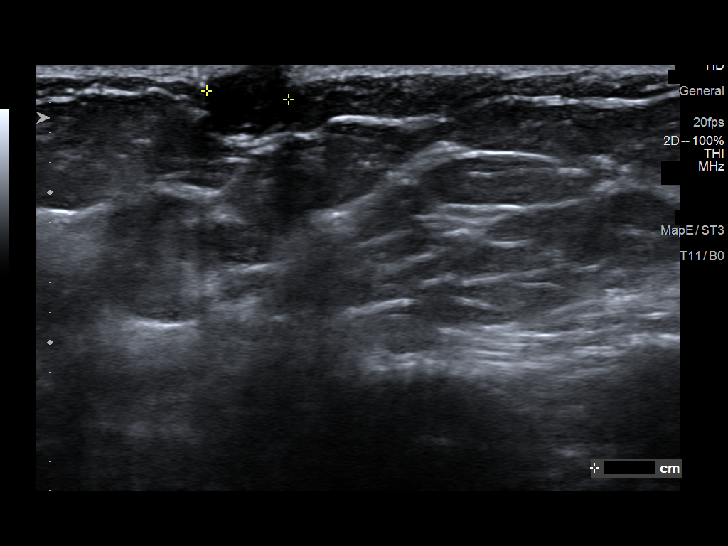
[im 13/14]
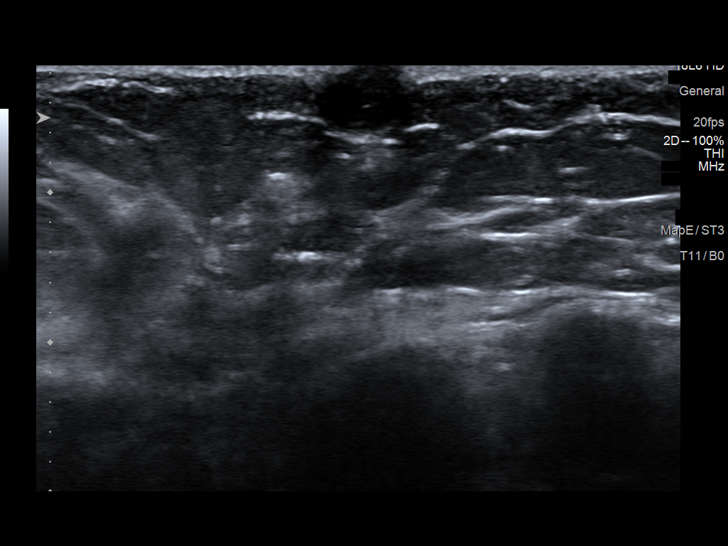
[im 14/14]
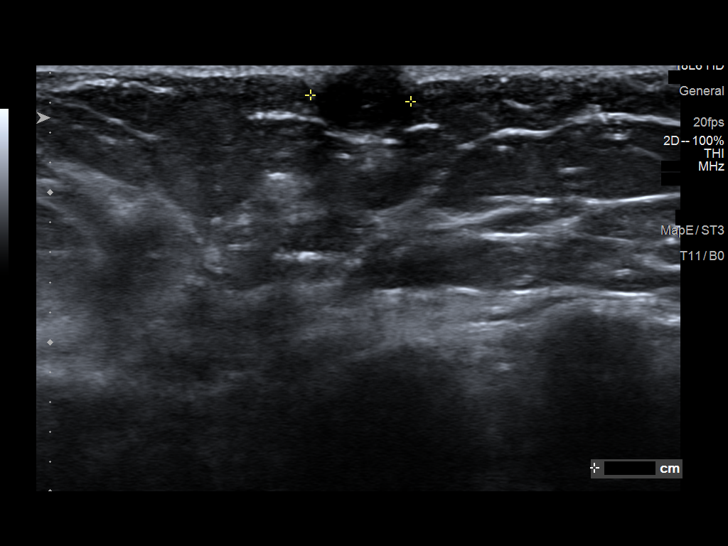

[13 of 14 positions shown; findings below may reference images not displayed]

ACR Breast Density Category c: The breast tissue is heterogeneously
dense, which may obscure small masses.
FINDINGS: Spot compression views including tomography of the upper outer left
breast confirm at least 2 circumscribed oval masses, the largest
approximately 1.7 cm.

Targeted ultrasound is performed, showing two simple cysts in the
left breast in the region of mammographic concern. At 2 o'clock
position 5 cm from the nipple is a 2.1 x 0.9 x 1.7 cm cyst. At 3
o'clock position 5 cm from the nipple is a 0.7 x 0.8 x 0.5 cm simple
cyst.

The patient shows me a palpable area of concern in the far medial
contralateral right breast, where physical exam findings are most
consistent with a benign sebaceous cyst. At no additional charge,
confirmatory ultrasound images of the palpable sebaceous cyst in the
right breast are performed showing a mildly complex cyst in the
subcutaneous fat of the right breast extending into the dermis, with
a tract to the superficial aspect of the dermis. This measures
cm greatest diameter.
IMPRESSION: 1. Benign simple cysts in the 2 and 3 o'clock positions of the left
breast. No evidence of malignancy is identified in the upper outer
quadrant of the left breast.
2. The patient palpates a benign sebaceous cyst the medial right
breast.

RECOMMENDATION:
Screening mammogram in one year.(Code:[id number])

I have discussed the findings and recommendations with the patient.
Results were also provided in writing at the conclusion of the
visit. If applicable, a reminder letter will be sent to the patient
regarding the next appointment.

BI-RADS CATEGORY  2: Benign.

## 2019-05-26 ENCOUNTER — Other Ambulatory Visit: Payer: Self-pay | Admitting: Obstetrics and Gynecology

## 2019-05-26 DIAGNOSIS — N6310 Unspecified lump in the right breast, unspecified quadrant: Secondary | ICD-10-CM

## 2019-06-05 ENCOUNTER — Ambulatory Visit
Admission: RE | Admit: 2019-06-05 | Discharge: 2019-06-05 | Disposition: A | Discharge status: Home / Self Care | Payer: Commercial Managed Care - PPO | Source: Ambulatory Visit | Attending: Obstetrics and Gynecology | Admitting: Obstetrics and Gynecology

## 2019-06-05 ENCOUNTER — Other Ambulatory Visit: Payer: Self-pay

## 2019-06-05 DIAGNOSIS — N6310 Unspecified lump in the right breast, unspecified quadrant: Secondary | ICD-10-CM

## 2019-07-05 ENCOUNTER — Telehealth: Payer: Self-pay | Admitting: *Deleted

## 2019-07-05 NOTE — Telephone Encounter (Signed)
Pt states she is not having any problems. Is going to follow up with PCP

## 2019-07-05 NOTE — Telephone Encounter (Signed)
Meagan Greer left a message stating she saw Dr Alen Blew in 2012- had been positive for the  Lupus Anticoagulant gene. She is now negative. States Dr Alen Blew predicted that might happen and to come back to see him if it happens.

## 2019-07-05 NOTE — Telephone Encounter (Signed)
No follow up is needed unless new issues are noted. If so, I need records from her PCP or Ob/Gyn.

## 2020-08-08 IMAGING — MG MM DIGITAL DIAGNOSTIC UNILAT*R* W/ TOMO W/ CAD
2 series · 3 of 6 positions shown · non-contrast
Comparison: Multiple prior studies including 05/25/2019

CLINICAL DATA: Palpable abnormality in the MEDIAL aspect of the
RIGHT breast, noted by the patient for 2 years and recently
identified on physical exam.

EXAM:
DIGITAL DIAGNOSTIC RIGHT MAMMOGRAM WITH TOMO
ULTRASOUND RIGHT BREAST

[R CC synth-2D]
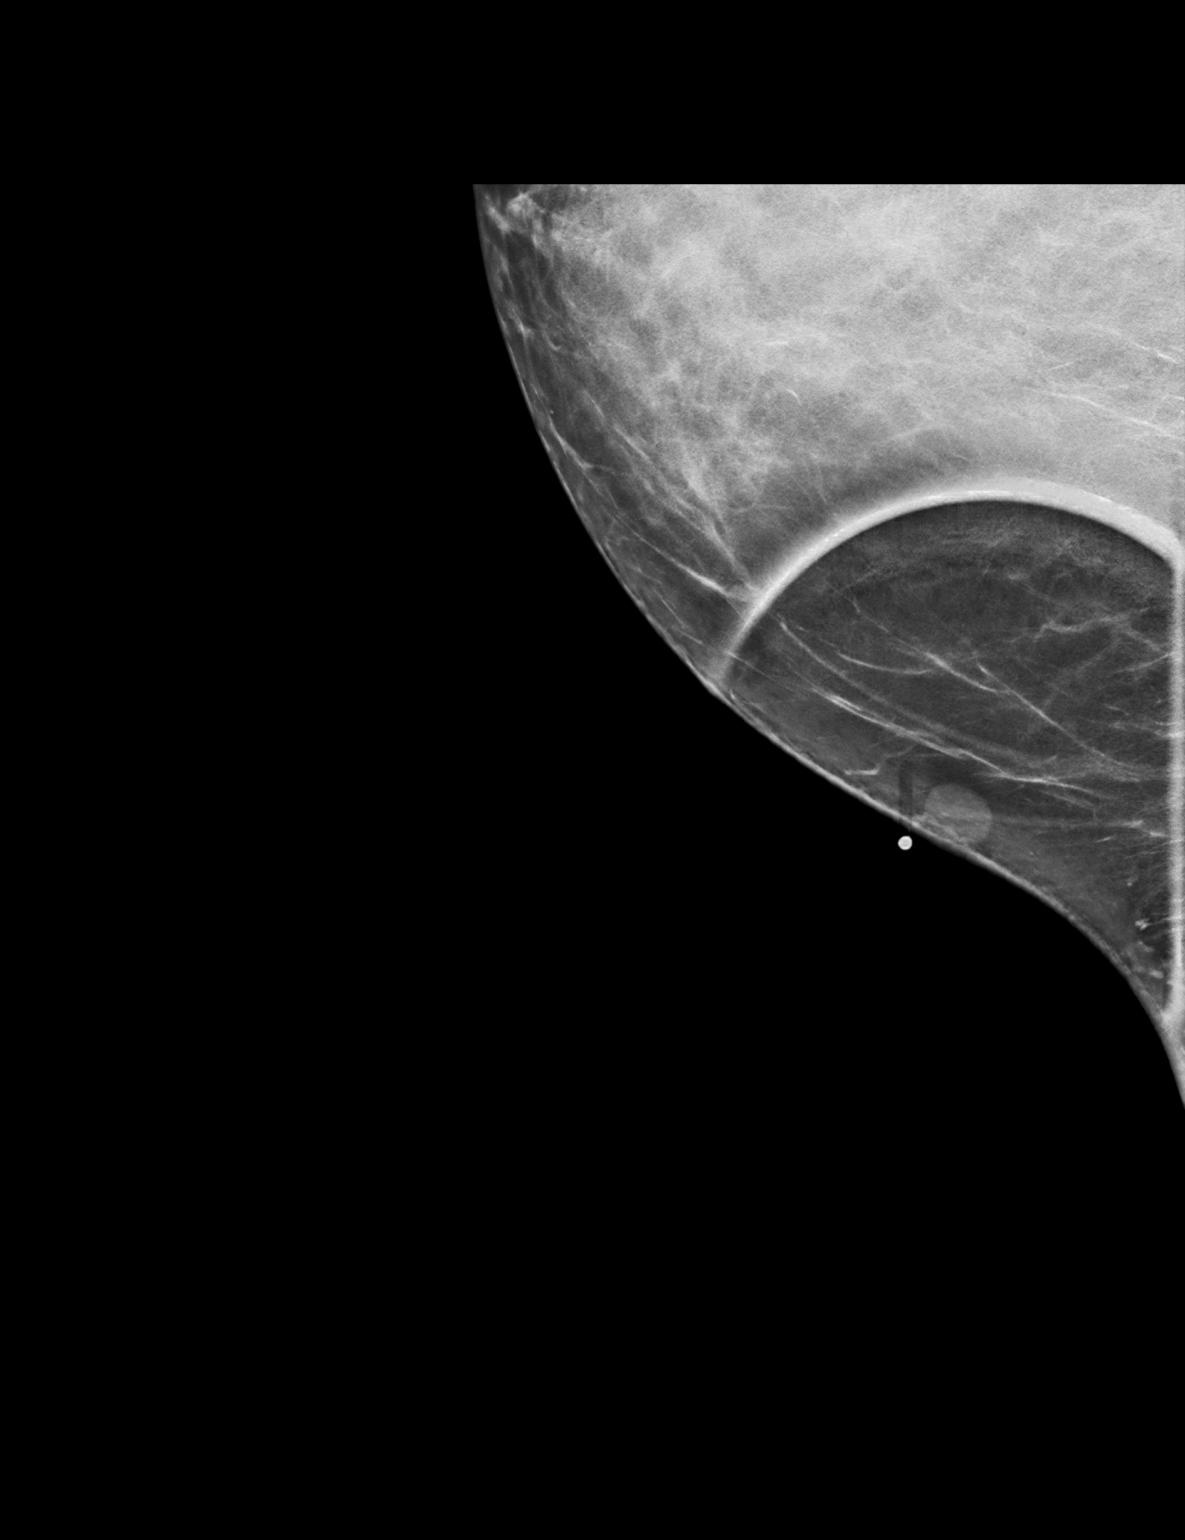

[R CC tomo · 2 of 59 frames shown]
[frame 20/59]
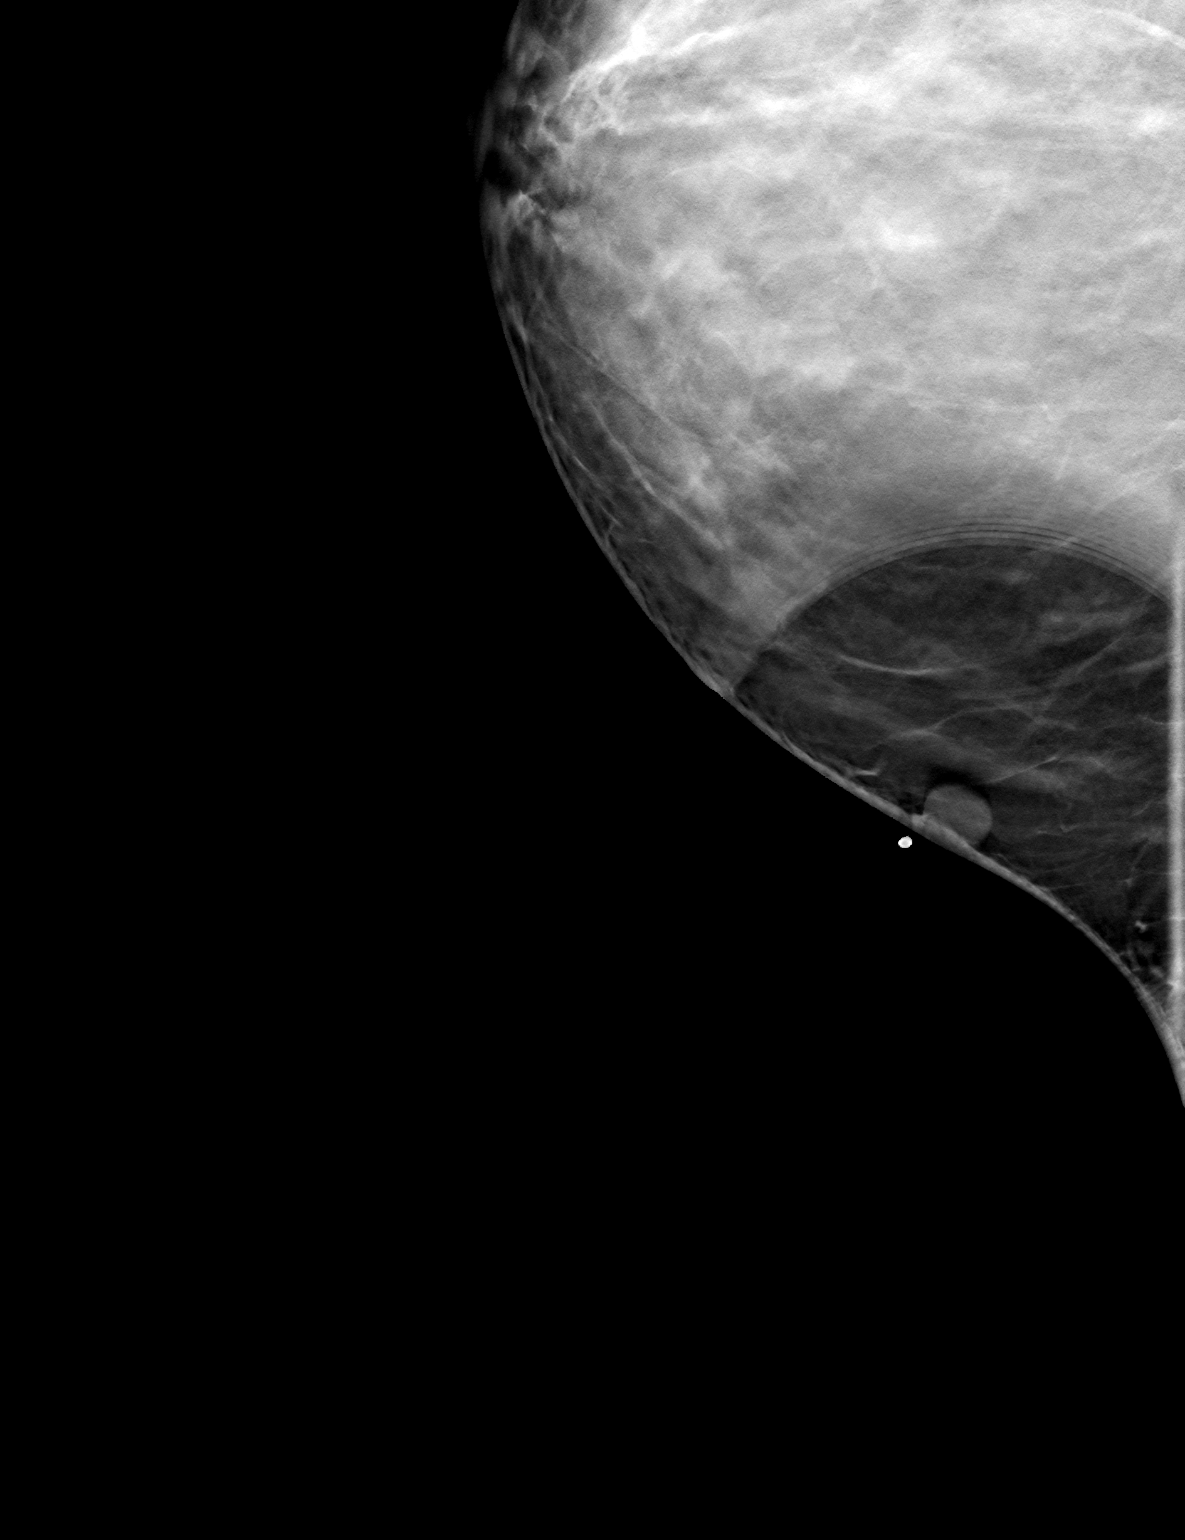
[frame 30/59]
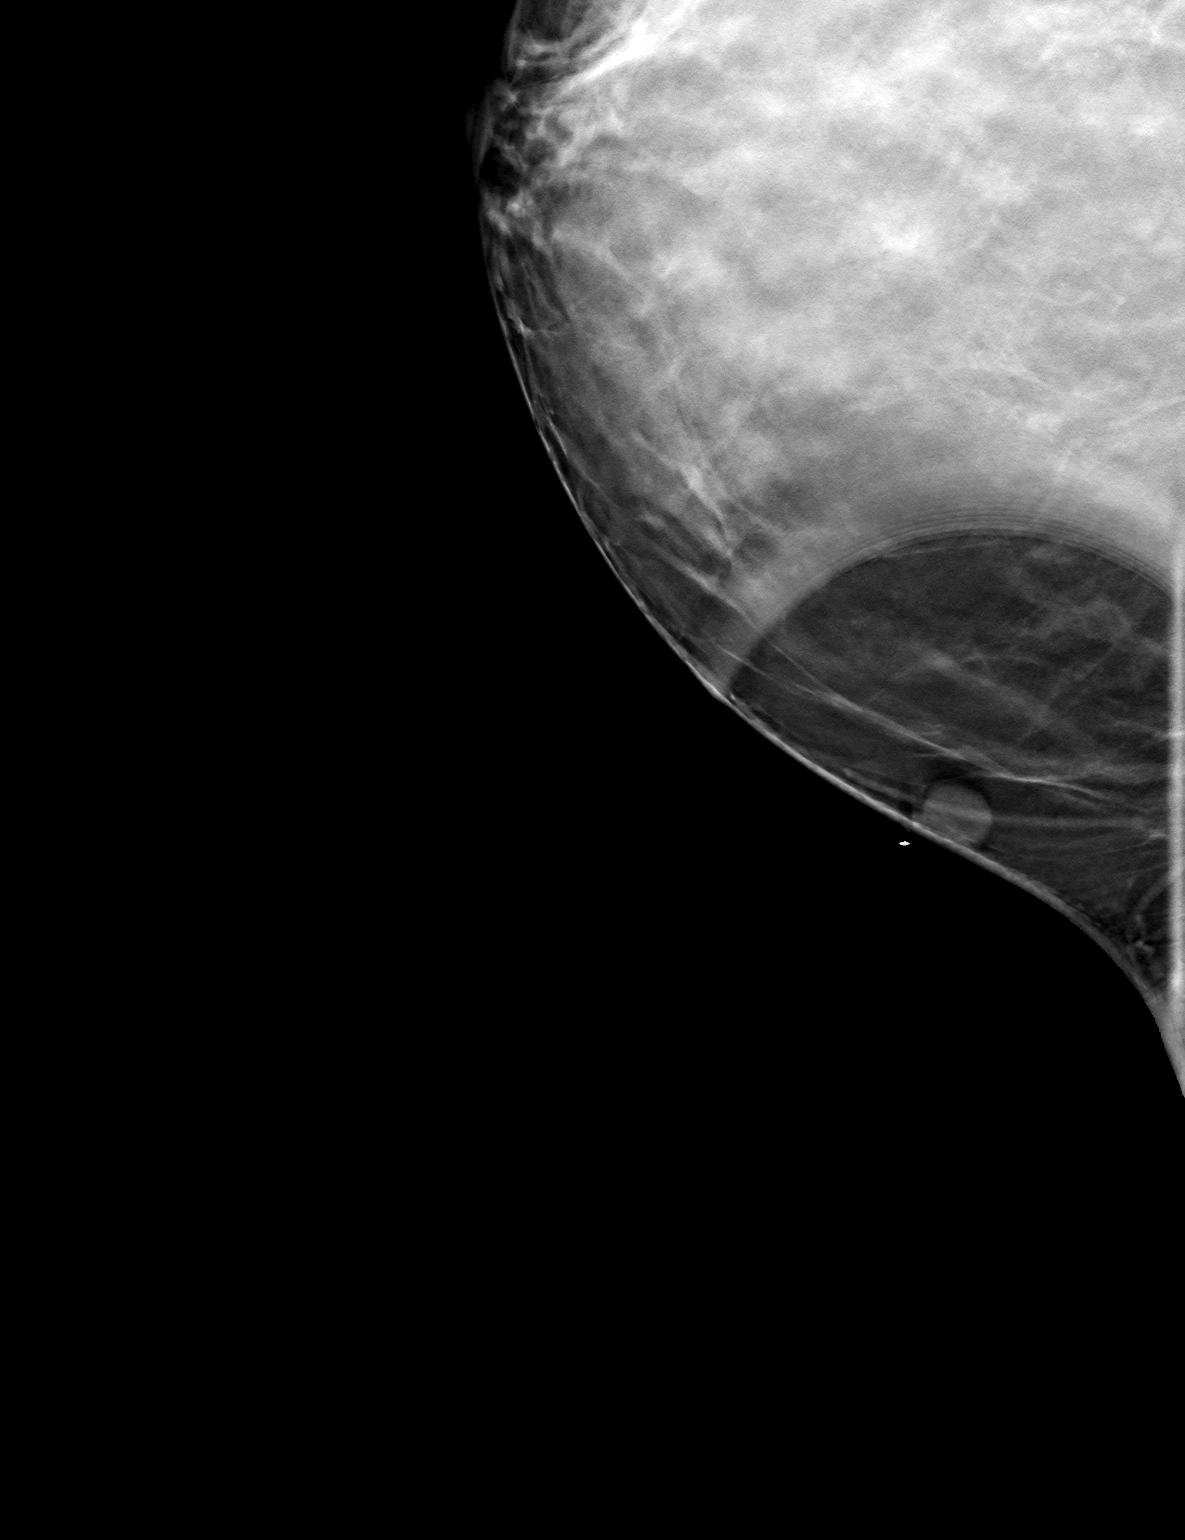

[3 of 6 positions shown; findings below may reference images not displayed]

ACR Breast Density Category c: The breast tissue is heterogeneously
dense, which may obscure small masses.
FINDINGS: Spot tangential view is performed in the MEDIAL aspect of the RIGHT
breast, demonstrating a superficial oval isoechoic mass measuring 1
centimeter in diameter.

On physical exam, there is a 1.0 centimeter papule in the 4 o'clock
location of the RIGHT breast, parasternal region. There is no
associated erythema or drainage. I am able to visualized a central
punctum.

Targeted ultrasound is performed, showing a partially intradermal
and partially subdermal oval hypoechoic mass with posterior acoustic
enhancement in the 4 o'clock location of the RIGHT breast 10
centimeters from the nipple which measures 0.9 x 0.7 x
centimeters. There is associated sinus tract through the dermis,
confirming an intradermal inclusion cyst.
IMPRESSION: Benign intradermal inclusion cysts in the RIGHT breast.

RECOMMENDATION:
Screening mammogram is recommended in May 2020.

I have discussed the findings and recommendations with the patient.
If applicable, a reminder letter will be sent to the patient
regarding the next appointment.

BI-RADS CATEGORY  2: Benign.
